# Patient Record
Sex: Female | Born: 1980 | Race: Black or African American | Hispanic: No | Marital: Single | Smoking: Light tobacco smoker
Health system: Southern US, Community
[De-identification: ages and names within clinical notes are randomized; demographics above are authoritative.]

---

## 2002-06-10 ENCOUNTER — Emergency Department (HOSPITAL_COMMUNITY): Admission: EM | Admit: 2002-06-10 | Discharge: 2002-06-10 | Payer: Self-pay | Admitting: Emergency Medicine

## 2003-05-25 ENCOUNTER — Emergency Department (HOSPITAL_COMMUNITY): Admission: EM | Admit: 2003-05-25 | Discharge: 2003-05-25 | Payer: Self-pay | Admitting: Emergency Medicine

## 2014-02-06 ENCOUNTER — Encounter: Payer: Self-pay | Admitting: Internal Medicine

## 2014-02-06 ENCOUNTER — Ambulatory Visit: Payer: Medicaid Other | Attending: Internal Medicine | Admitting: Internal Medicine

## 2014-02-06 VITALS — BP 118/82 | HR 89 | Temp 98.0°F | Resp 16 | Wt 148.4 lb

## 2014-02-06 DIAGNOSIS — Z139 Encounter for screening, unspecified: Secondary | ICD-10-CM | POA: Diagnosis not present

## 2014-02-06 DIAGNOSIS — L709 Acne, unspecified: Secondary | ICD-10-CM

## 2014-02-06 LAB — COMPLETE METABOLIC PANEL WITH GFR
ALT: 20 U/L (ref 0–35)
AST: 21 U/L (ref 0–37)
Albumin: 3.7 g/dL (ref 3.5–5.2)
Alkaline Phosphatase: 62 U/L (ref 39–117)
BILIRUBIN TOTAL: 0.6 mg/dL (ref 0.2–1.2)
BUN: 8 mg/dL (ref 6–23)
CO2: 26 mEq/L (ref 19–32)
Calcium: 9.6 mg/dL (ref 8.4–10.5)
Chloride: 103 mEq/L (ref 96–112)
Creat: 0.88 mg/dL (ref 0.50–1.10)
GFR, Est African American: >89 mL/min
GFR, Est Non African American: 88 mL/min
GLUCOSE: 77 mg/dL (ref 70–99)
Potassium: 4.4 mEq/L (ref 3.5–5.3)
SODIUM: 139 meq/L (ref 135–145)
TOTAL PROTEIN: 6.6 g/dL (ref 6.0–8.3)

## 2014-02-06 LAB — CBC WITH DIFFERENTIAL/PLATELET
BASOS ABS: 0 10*3/uL (ref 0.0–0.1)
Basophils Relative: 0 % (ref 0–1)
Eosinophils Absolute: 0.1 10*3/uL (ref 0.0–0.7)
Eosinophils Relative: 1 % (ref 0–5)
HCT: 40.3 % (ref 36.0–46.0)
HEMOGLOBIN: 13.6 g/dL (ref 12.0–15.0)
LYMPHS ABS: 2.6 10*3/uL (ref 0.7–4.0)
Lymphocytes Relative: 33 % (ref 12–46)
MCH: 28 pg (ref 26.0–34.0)
MCHC: 33.7 g/dL (ref 30.0–36.0)
MCV: 83.1 fL (ref 78.0–100.0)
MONOS PCT: 8 % (ref 3–12)
MPV: 11.8 fL (ref 8.6–12.4)
Monocytes Absolute: 0.6 10*3/uL (ref 0.1–1.0)
Neutro Abs: 4.5 10*3/uL (ref 1.7–7.7)
Neutrophils Relative %: 58 % (ref 43–77)
PLATELETS: 223 10*3/uL (ref 150–400)
RBC: 4.85 MIL/uL (ref 3.87–5.11)
RDW: 16.2 % — AB (ref 11.5–15.5)
WBC: 7.8 10*3/uL (ref 4.0–10.5)

## 2014-02-06 LAB — HEMOGLOBIN A1C
HEMOGLOBIN A1C: 6 % — AB (ref <5.7)
MEAN PLASMA GLUCOSE: 126 mg/dL — AB (ref <117)

## 2014-02-06 NOTE — Progress Notes (Signed)
Patient here to establish care Requesting a referral to dermatology for her acne Patient does not want the flu vaccine

## 2014-02-06 NOTE — Progress Notes (Signed)
Patient Demographics  Marisa Frazier, is a 34 y.o. female  ZOX:[id number]  WJX:[id number]  DOB - Jun 15, 1980  CC:  Chief Complaint  Patient presents with  . Establish Care       HPI: Marisa Frazier is a 34 y.o. female here today to establish medical care.Patient reported to have history of acne for several years as per patient she recently moved to [facility] and apparently she was following up with her dermatologist and has been  Using topical creams including tretinoin, adapalene  benzoyl peroxide , topical clindamycin as well as oral doxycycline, as per patient his medication still does not work well, patient is requesting referral to see a dermatologist. She denies any acute symptoms. Patient has No headache, No chest pain, No abdominal pain - No Nausea, No new weakness tingling or numbness, No Cough - SOB.  Allergies  Allergen Reactions  . Dilaudid [Hydromorphone Hcl]    History reviewed. No pertinent past medical history. No current outpatient prescriptions on file prior to visit.   No current facility-administered medications on file prior to visit.   Family History  Problem Relation Age of Onset  . Heart disease Maternal Grandmother    History   Social History  . Marital Status: Single    Spouse Name: N/A    Number of Children: N/A  . Years of Education: N/A   Occupational History  . Not on file.   Social History Main Topics  . Smoking status: Never Smoker   . Smokeless tobacco: Not on file  . Alcohol Use: No  . Drug Use: Not on file  . Sexual Activity: Not on file   Other Topics Concern  . Not on file   Social History Narrative  . No narrative on file    Review of Systems: Constitutional: Negative for fever, chills, diaphoresis, activity change, appetite change and fatigue. HENT: Negative for ear pain, nosebleeds, congestion, facial swelling, rhinorrhea, neck pain, neck stiffness and ear discharge.  Eyes: Negative for pain, discharge,  redness, itching and visual disturbance. Respiratory: Negative for cough, choking, chest tightness, shortness of breath, wheezing and stridor.  Cardiovascular: Negative for chest pain, palpitations and leg swelling. Gastrointestinal: Negative for abdominal distention. Genitourinary: Negative for dysuria, urgency, frequency, hematuria, flank pain, decreased urine volume, difficulty urinating and dyspareunia.  Musculoskeletal: Negative for back pain, joint swelling, arthralgia and gait problem. Neurological: Negative for dizziness, tremors, seizures, syncope, facial asymmetry, speech difficulty, weakness, light-headedness, numbness and headaches.  Hematological: Negative for adenopathy. Does not bruise/bleed easily. Psychiatric/Behavioral: Negative for hallucinations, behavioral problems, confusion, dysphoric mood, decreased concentration and agitation.    Objective:   Filed Vitals:   02/06/14 1005  BP: 118/82  Pulse: 89  Temp: 98 F (36.7 C)  Resp: 16    Physical Exam: Constitutional: Patient appears well-developed and well-nourished. No distress. HENT: Normocephalic, atraumatic, External right and left ear normal. Oropharynx is clear and moist.  Eyes: Conjunctivae and EOM are normal. PERRLA, no scleral icterus. Neck: Normal ROM. Neck supple. No JVD. No tracheal deviation. No thyromegaly. CVS: RRR, S1/S2 +, no murmurs, no gallops, no carotid bruit.  Pulmonary: Effort and breath sounds normal, no stridor, rhonchi, wheezes, rales.  Abdominal: Soft. BS +, no distension, tenderness, rebound or guarding.  Musculoskeletal: Normal range of motion. No edema and no tenderness.  Neuro: Alert. Normal reflexes, muscle tone coordination. No cranial nerve deficit. Skin: Skin is warm and dry. No rash noted. Not diaphoretic. No erythema. No pallor. Psychiatric: Normal mood and affect. Behavior,  judgment, thought content normal.  No results found for: WBC, HGB, HCT, MCV, PLT No results found for:  CREATININE, BUN, NA, K, CL, CO2  No results found for: HGBA1C Lipid Panel  No results found for: CHOL, TRIG, HDL, CHOLHDL, VLDL, LDLCALC     Assessment and plan:   1. Acne, unspecified acne type Continue with current medications, - Ambulatory referral to Dermatology  2. Screening Ordered baseline blood work. - CBC with Differential/Platelet - COMPLETE METABOLIC PANEL WITH GFR - TSH - Vit D  25 hydroxy (rtn osteoporosis monitoring) - Hemoglobin A1c        Health Maintenance  -Pap Smear:Will be scheduled  -Vaccinations:  Patient declines flu shot.   Return in about 3 months (around 05/07/2014), or if symptoms worsen or fail to improve, for Schedule Appt with Dr Glendora ScoreFunches/ Valerie for PAP.   Doris CheadleADVANI, Jaston Havens, MD

## 2014-02-07 LAB — VITAMIN D 25 HYDROXY (VIT D DEFICIENCY, FRACTURES): Vit D, 25-Hydroxy: 29 ng/mL — ABNORMAL LOW (ref 30–100)

## 2014-02-07 LAB — TSH: TSH: 0.629 u[IU]/mL (ref 0.350–4.500)

## 2014-02-14 ENCOUNTER — Telehealth: Payer: Self-pay

## 2014-02-14 NOTE — Telephone Encounter (Signed)
Patient not available Unable to leave message Home number does not accept call Work number has been disconnected

## 2014-02-14 NOTE — Telephone Encounter (Signed)
-----   Message from Doris Cheadleeepak Advani, MD sent at 02/07/2014  9:12 AM EST ----- Blood work reviewed noticed hemoglobin A1c of 6.0%, patient has prediabetes, call and advise patient for low carbohydrate diet.  noticed low vitamin D, advise patient to start taking OTC 2000 units daily.

## 2014-04-29 ENCOUNTER — Ambulatory Visit: Payer: Medicaid Other

## 2014-07-21 ENCOUNTER — Emergency Department (HOSPITAL_COMMUNITY): Payer: Medicaid Other

## 2014-07-21 ENCOUNTER — Encounter (HOSPITAL_COMMUNITY): Payer: Self-pay | Admitting: *Deleted

## 2014-07-21 ENCOUNTER — Emergency Department (HOSPITAL_COMMUNITY)
Admission: EM | Admit: 2014-07-21 | Discharge: 2014-07-21 | Disposition: A | Discharge status: Home / Self Care | Payer: Medicaid Other | Attending: Emergency Medicine | Admitting: Emergency Medicine

## 2014-07-21 DIAGNOSIS — Z792 Long term (current) use of antibiotics: Secondary | ICD-10-CM | POA: Insufficient documentation

## 2014-07-21 DIAGNOSIS — Y9389 Activity, other specified: Secondary | ICD-10-CM | POA: Insufficient documentation

## 2014-07-21 DIAGNOSIS — Y998 Other external cause status: Secondary | ICD-10-CM | POA: Insufficient documentation

## 2014-07-21 DIAGNOSIS — S161XXA Strain of muscle, fascia and tendon at neck level, initial encounter: Secondary | ICD-10-CM | POA: Diagnosis not present

## 2014-07-21 DIAGNOSIS — S199XXA Unspecified injury of neck, initial encounter: Secondary | ICD-10-CM | POA: Diagnosis present

## 2014-07-21 DIAGNOSIS — Y92410 Unspecified street and highway as the place of occurrence of the external cause: Secondary | ICD-10-CM | POA: Diagnosis not present

## 2014-07-21 MED ORDER — CYCLOBENZAPRINE HCL 10 MG PO TABS
10.0000 mg | ORAL_TABLET | Freq: Two times a day (BID) | ORAL | Status: DC | PRN
Start: 2014-07-21 — End: 2014-09-09

## 2014-07-21 MED ORDER — IBUPROFEN 800 MG PO TABS: 800.0000 mg | ORAL_TABLET | Freq: Three times a day (TID) | ORAL | Status: DC

## 2014-07-21 MED ORDER — IBUPROFEN 800 MG PO TABS
800.0000 mg | ORAL_TABLET | Freq: Once | ORAL | Status: AC
  Administered 2014-07-21: 800 mg via ORAL
  Filled 2014-07-21: qty 1

## 2014-07-21 NOTE — Discharge Instructions (Signed)
Cervical Sprain °A cervical sprain is an injury in the neck in which the strong, fibrous tissues (ligaments) that connect your neck bones stretch or tear. Cervical sprains can range from mild to severe. Severe cervical sprains can cause the neck vertebrae to be unstable. This can lead to damage of the spinal cord and can result in serious nervous system problems. The amount of time it takes for a cervical sprain to get better depends on the cause and extent of the injury. Most cervical sprains heal in 1 to 3 weeks. °CAUSES  °Severe cervical sprains may be caused by:  °· Contact sport injuries (such as from football, rugby, wrestling, hockey, auto racing, gymnastics, diving, martial arts, or boxing).   °· Motor vehicle collisions.   °· Whiplash injuries. This is an injury from a sudden forward and backward whipping movement of the head and neck.  °· Falls.   °Mild cervical sprains may be caused by:  °· Being in an awkward position, such as while cradling a telephone between your ear and shoulder.   °· Sitting in a chair that does not offer proper support.   °· Working at a poorly designed computer station.   °· Looking up or down for long periods of time.   °SYMPTOMS  °· Pain, soreness, stiffness, or a burning sensation in the front, back, or sides of the neck. This discomfort may develop immediately after the injury or slowly, 24 hours or more after the injury.   °· Pain or tenderness directly in the middle of the back of the neck.   °· Shoulder or upper back pain.   °· Limited ability to move the neck.   °· Headache.   °· Dizziness.   °· Weakness, numbness, or tingling in the hands or arms.   °· Muscle spasms.   °· Difficulty swallowing or chewing.   °· Tenderness and swelling of the neck.   °DIAGNOSIS  °Most of the time your health care provider can diagnose a cervical sprain by taking your history and doing a physical exam. Your health care provider will ask about previous neck injuries and any known neck  problems, such as arthritis in the neck. X-rays may be taken to find out if there are any other problems, such as with the bones of the neck. Other tests, such as a CT scan or MRI, may also be needed.  °TREATMENT  °Treatment depends on the severity of the cervical sprain. Mild sprains can be treated with rest, keeping the neck in place (immobilization), and pain medicines. Severe cervical sprains are immediately immobilized. Further treatment is done to help with pain, muscle spasms, and other symptoms and may include: °· Medicines, such as pain relievers, numbing medicines, or muscle relaxants.   °· Physical therapy. This may involve stretching exercises, strengthening exercises, and posture training. Exercises and improved posture can help stabilize the neck, strengthen muscles, and help stop symptoms from returning.   °HOME CARE INSTRUCTIONS  °· Put ice on the injured area.   °· Put ice in a plastic bag.   °· Place a towel between your skin and the bag.   °· Leave the ice on for 15-20 minutes, 3-4 times a day.   °· If your injury was severe, you may have been given a cervical collar to wear. A cervical collar is a two-piece collar designed to keep your neck from moving while it heals. °· Do not remove the collar unless instructed by your health care provider. °· If you have long hair, keep it outside of the collar. °· Ask your health care provider before making any adjustments to your collar. Minor   adjustments may be required over time to improve comfort and reduce pressure on your chin or on the back of your head. °· If you are allowed to remove the collar for cleaning or bathing, follow your health care provider's instructions on how to do so safely. °· Keep your collar clean by wiping it with mild soap and water and drying it completely. If the collar you have been given includes removable pads, remove them every 1-2 days and hand wash them with soap and water. Allow them to air dry. They should be completely  dry before you wear them in the collar. °· If you are allowed to remove the collar for cleaning and bathing, wash and dry the skin of your neck. Check your skin for irritation or sores. If you see any, tell your health care provider. °· Do not drive while wearing the collar.   °· Only take over-the-counter or prescription medicines for pain, discomfort, or fever as directed by your health care provider.   °· Keep all follow-up appointments as directed by your health care provider.   °· Keep all physical therapy appointments as directed by your health care provider.   °· Make any needed adjustments to your workstation to promote good posture.   °· Avoid positions and activities that make your symptoms worse.   °· Warm up and stretch before being active to help prevent problems.   °SEEK MEDICAL CARE IF:  °· Your pain is not controlled with medicine.   °· You are unable to decrease your pain medicine over time as planned.   °· Your activity level is not improving as expected.   °SEEK IMMEDIATE MEDICAL CARE IF:  °· You develop any bleeding. °· You develop stomach upset. °· You have signs of an allergic reaction to your medicine.   °· Your symptoms get worse.   °· You develop new, unexplained symptoms.   °· You have numbness, tingling, weakness, or paralysis in any part of your body.   °MAKE SURE YOU:  °· Understand these instructions. °· Will watch your condition. °· Will get help right away if you are not doing well or get worse. °Document Released: 10/18/2006 Document Revised: 12/26/2012 Document Reviewed: 06/28/2012 °ExitCare® Patient Information ©2015 [employer]. This information is not intended to replace advice given to you by your health care provider. Make sure you discuss any questions you have with your health care provider. ° °Cryotherapy °Cryotherapy means treatment with cold. Ice or gel packs can be used to reduce both pain and swelling. Ice is the most helpful within the first 24 to 48 hours after an  injury or flare-up from overusing a muscle or joint. Sprains, strains, spasms, burning pain, shooting pain, and aches can all be eased with ice. Ice can also be used when recovering from surgery. Ice is effective, has very few side effects, and is safe for most people to use. °PRECAUTIONS  °Ice is not a safe treatment option for people with: °· Raynaud phenomenon. This is a condition affecting small blood vessels in the extremities. Exposure to cold may cause your problems to return. °· Cold hypersensitivity. There are many forms of cold hypersensitivity, including: °¨ Cold urticaria. Red, itchy hives appear on the skin when the tissues begin to warm after being iced. °¨ Cold erythema. This is a red, itchy rash caused by exposure to cold. °¨ Cold hemoglobinuria. Red blood cells break down when the tissues begin to warm after being iced. The hemoglobin that carry oxygen are passed into the urine because they cannot combine with blood proteins fast enough. °·   Numbness or altered sensitivity in the area being iced. °If you have any of the following conditions, do not use ice until you have discussed cryotherapy with your caregiver: °· Heart conditions, such as arrhythmia, angina, or chronic heart disease. °· High blood pressure. °· Healing wounds or open skin in the area being iced. °· Current infections. °· Rheumatoid arthritis. °· Poor circulation. °· Diabetes. °Ice slows the blood flow in the region it is applied. This is beneficial when trying to stop inflamed tissues from spreading irritating chemicals to surrounding tissues. However, if you expose your skin to cold temperatures for too long or without the proper protection, you can damage your skin or nerves. Watch for signs of skin damage due to cold. °HOME CARE INSTRUCTIONS °Follow these tips to use ice and cold packs safely. °· Place a dry or damp towel between the ice and skin. A damp towel will cool the skin more quickly, so you may need to shorten the time  that the ice is used. °· For a more rapid response, add gentle compression to the ice. °· Ice for no more than 10 to 20 minutes at a time. The bonier the area you are icing, the less time it will take to get the benefits of ice. °· Check your skin after 5 minutes to make sure there are no signs of a poor response to cold or skin damage. °· Rest 20 minutes or more between uses. °· Once your skin is numb, you can end your treatment. You can test numbness by very lightly touching your skin. The touch should be so light that you do not see the skin dimple from the pressure of your fingertip. When using ice, most people will feel these normal sensations in this order: cold, burning, aching, and numbness. °· Do not use ice on someone who cannot communicate their responses to pain, such as small children or people with dementia. °HOW TO MAKE AN ICE PACK °Ice packs are the most common way to use ice therapy. Other methods include ice massage, ice baths, and cryosprays. Muscle creams that cause a cold, tingly feeling do not offer the same benefits that ice offers and should not be used as a substitute unless recommended by your caregiver. °To make an ice pack, do one of the following: °· Place crushed ice or a bag of frozen vegetables in a sealable plastic bag. Squeeze out the excess air. Place this bag inside another plastic bag. Slide the bag into a pillowcase or place a damp towel between your skin and the bag. °· Mix 3 parts water with 1 part rubbing alcohol. Freeze the mixture in a sealable plastic bag. When you remove the mixture from the freezer, it will be slushy. Squeeze out the excess air. Place this bag inside another plastic bag. Slide the bag into a pillowcase or place a damp towel between your skin and the bag. °SEEK MEDICAL CARE IF: °· You develop white spots on your skin. This may give the skin a blotchy (mottled) appearance. °· Your skin turns blue or pale. °· Your skin becomes waxy or hard. °· Your swelling  gets worse. °MAKE SURE YOU:  °· Understand these instructions. °· Will watch your condition. °· Will get help right away if you are not doing well or get worse. °Document Released: 08/17/2010 Document Revised: 05/07/2013 Document Reviewed: 08/17/2010 °ExitCare® Patient Information ©2015 [employer]. This information is not intended to replace advice given to you by your health care provider. Make   sure you discuss any questions you have with your health care provider. °Motor Vehicle Collision °It is common to have multiple bruises and sore muscles after a motor vehicle collision (MVC). These tend to feel worse for the first 24 hours. You may have the most stiffness and soreness over the first several hours. You may also feel worse when you wake up the first morning after your collision. After this point, you will usually begin to improve with each day. The speed of improvement often depends on the severity of the collision, the number of injuries, and the location and nature of these injuries. °HOME CARE INSTRUCTIONS °· Put ice on the injured area. °¨ Put ice in a plastic bag. °¨ Place a towel between your skin and the bag. °¨ Leave the ice on for 15-20 minutes, 3-4 times a day, or as directed by your health care provider. °· Drink enough fluids to keep your urine clear or pale yellow. Do not drink alcohol. °· Take a warm shower or bath once or twice a day. This will increase blood flow to sore muscles. °· You may return to activities as directed by your caregiver. Be careful when lifting, as this may aggravate neck or back pain. °· Only take over-the-counter or prescription medicines for pain, discomfort, or fever as directed by your caregiver. Do not use aspirin. This may increase bruising and bleeding. °SEEK IMMEDIATE MEDICAL CARE IF: °· You have numbness, tingling, or weakness in the arms or legs. °· You develop severe headaches not relieved with medicine. °· You have severe neck pain, especially tenderness in  the middle of the back of your neck. °· You have changes in bowel or bladder control. °· There is increasing pain in any area of the body. °· You have shortness of breath, light-headedness, dizziness, or fainting. °· You have chest pain. °· You feel sick to your stomach (nauseous), throw up (vomit), or sweat. °· You have increasing abdominal discomfort. °· There is blood in your urine, stool, or vomit. °· You have pain in your shoulder (shoulder strap areas). °· You feel your symptoms are getting worse. °MAKE SURE YOU: °· Understand these instructions. °· Will watch your condition. °· Will get help right away if you are not doing well or get worse. °Document Released: 12/21/2004 Document Revised: 05/07/2013 Document Reviewed: 05/20/2010 °ExitCare® Patient Information ©2015 [employer]. This information is not intended to replace advice given to you by your health care provider. Make sure you discuss any questions you have with your health care provider. ° °
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
## 2014-07-26 ENCOUNTER — Emergency Department (HOSPITAL_COMMUNITY)
Admission: EM | Admit: 2014-07-26 | Discharge: 2014-07-26 | Disposition: A | Discharge status: Home / Self Care | Payer: Medicaid Other | Attending: Emergency Medicine | Admitting: Emergency Medicine

## 2014-07-26 ENCOUNTER — Encounter (HOSPITAL_COMMUNITY): Payer: Self-pay | Admitting: Emergency Medicine

## 2014-07-26 DIAGNOSIS — Z791 Long term (current) use of non-steroidal anti-inflammatories (NSAID): Secondary | ICD-10-CM | POA: Diagnosis not present

## 2014-07-26 DIAGNOSIS — M62838 Other muscle spasm: Secondary | ICD-10-CM

## 2014-07-26 DIAGNOSIS — Y998 Other external cause status: Secondary | ICD-10-CM | POA: Insufficient documentation

## 2014-07-26 DIAGNOSIS — Y92410 Unspecified street and highway as the place of occurrence of the external cause: Secondary | ICD-10-CM | POA: Insufficient documentation

## 2014-07-26 DIAGNOSIS — Y9389 Activity, other specified: Secondary | ICD-10-CM | POA: Diagnosis not present

## 2014-07-26 DIAGNOSIS — S199XXA Unspecified injury of neck, initial encounter: Secondary | ICD-10-CM | POA: Diagnosis not present

## 2014-07-26 DIAGNOSIS — S46811A Strain of other muscles, fascia and tendons at shoulder and upper arm level, right arm, initial encounter: Secondary | ICD-10-CM

## 2014-07-26 DIAGNOSIS — Z792 Long term (current) use of antibiotics: Secondary | ICD-10-CM | POA: Insufficient documentation

## 2014-07-26 DIAGNOSIS — S29012A Strain of muscle and tendon of back wall of thorax, initial encounter: Secondary | ICD-10-CM | POA: Diagnosis not present

## 2014-07-26 MED ORDER — DIAZEPAM 5 MG PO TABS: 5.0000 mg | ORAL_TABLET | Freq: Three times a day (TID) | ORAL | Status: DC | PRN

## 2014-07-26 MED ORDER — NAPROXEN 500 MG PO TABS: 500.0000 mg | ORAL_TABLET | Freq: Two times a day (BID) | ORAL | Status: DC
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
## 2014-08-14 ENCOUNTER — Inpatient Hospital Stay: Payer: Medicaid Other | Admitting: Family Medicine

## 2014-09-09 ENCOUNTER — Encounter (HOSPITAL_BASED_OUTPATIENT_CLINIC_OR_DEPARTMENT_OTHER): Payer: Self-pay | Admitting: *Deleted

## 2014-09-09 ENCOUNTER — Emergency Department (HOSPITAL_BASED_OUTPATIENT_CLINIC_OR_DEPARTMENT_OTHER)
Admission: EM | Admit: 2014-09-09 | Discharge: 2014-09-09 | Disposition: A | Discharge status: Home / Self Care | Payer: Medicaid Other | Attending: Emergency Medicine | Admitting: Emergency Medicine

## 2014-09-09 DIAGNOSIS — R110 Nausea: Secondary | ICD-10-CM | POA: Insufficient documentation

## 2014-09-09 DIAGNOSIS — R42 Dizziness and giddiness: Secondary | ICD-10-CM

## 2014-09-09 DIAGNOSIS — Z791 Long term (current) use of non-steroidal anti-inflammatories (NSAID): Secondary | ICD-10-CM | POA: Diagnosis not present

## 2014-09-09 DIAGNOSIS — Z79899 Other long term (current) drug therapy: Secondary | ICD-10-CM | POA: Insufficient documentation

## 2014-09-09 DIAGNOSIS — Z3202 Encounter for pregnancy test, result negative: Secondary | ICD-10-CM | POA: Insufficient documentation

## 2014-09-09 LAB — CBC WITH DIFFERENTIAL/PLATELET
BASOS PCT: 0 % (ref 0–1)
Basophils Absolute: 0 10*3/uL (ref 0.0–0.1)
EOS ABS: 0 10*3/uL (ref 0.0–0.7)
Eosinophils Relative: 1 % (ref 0–5)
HCT: 40.3 % (ref 36.0–46.0)
Hemoglobin: 13.4 g/dL (ref 12.0–15.0)
Lymphocytes Relative: 25 % (ref 12–46)
Lymphs Abs: 1.9 10*3/uL (ref 0.7–4.0)
MCH: 28.8 pg (ref 26.0–34.0)
MCHC: 33.3 g/dL (ref 30.0–36.0)
MCV: 86.7 fL (ref 78.0–100.0)
MONOS PCT: 6 % (ref 3–12)
Monocytes Absolute: 0.5 10*3/uL (ref 0.1–1.0)
NEUTROS ABS: 5.1 10*3/uL (ref 1.7–7.7)
NEUTROS PCT: 68 % (ref 43–77)
Platelets: 174 10*3/uL (ref 150–400)
RBC: 4.65 MIL/uL (ref 3.87–5.11)
RDW: 13.7 % (ref 11.5–15.5)
WBC: 7.6 10*3/uL (ref 4.0–10.5)

## 2014-09-09 LAB — BASIC METABOLIC PANEL
ANION GAP: 7 (ref 5–15)
BUN: 10 mg/dL (ref 6–20)
CALCIUM: 9.6 mg/dL (ref 8.9–10.3)
CO2: 24 mmol/L (ref 22–32)
CREATININE: 1.01 mg/dL — AB (ref 0.44–1.00)
Chloride: 106 mmol/L (ref 101–111)
Glucose, Bld: 96 mg/dL (ref 65–99)
Potassium: 4.4 mmol/L (ref 3.5–5.1)
Sodium: 137 mmol/L (ref 135–145)

## 2014-09-09 LAB — HCG, SERUM, QUALITATIVE: PREG SERUM: NEGATIVE

## 2014-09-09 MED ORDER — MECLIZINE HCL 25 MG PO TABS: 25.0000 mg | ORAL_TABLET | Freq: Once | ORAL | Status: DC

## 2014-09-09 MED ORDER — MECLIZINE HCL 25 MG PO TABS: 25.0000 mg | ORAL_TABLET | Freq: Three times a day (TID) | ORAL | Status: DC | PRN

## 2014-09-09 NOTE — ED Provider Notes (Signed)
CSN: [id number]     Arrival date & time 09/09/14  1145 History   First MD Initiated Contact with Patient 09/09/14 1228     Chief Complaint  Patient presents with  . Dizziness     (Consider location/radiation/quality/duration/timing/severity/associated sxs/prior Treatment) HPI Comments: Patient is a 34 year old female with no significant past medical history. She presents for evaluation of dizziness. 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 While cleaning of patient, she became acutely dizzy and felt as if she was spinning. She felt very nauseated but did not vomit. Every time she tried to stand up the dizziness worsened, and was eventually brought here by EMS for evaluation.  Patient is a 34 y.o. female presenting with dizziness. The history is provided by the patient.  Dizziness Quality:  Imbalance and head spinning Severity:  Moderate Onset quality:  Sudden Timing:  Constant Progression:  Unchanged Chronicity:  New Context: head movement   Context: not when bending over and not with loss of consciousness   Relieved by:  Nothing Worsened by:  Movement, turning head and standing up Ineffective treatments:  None tried Associated symptoms: nausea   Associated symptoms: no chest pain and no vomiting     History reviewed. No pertinent past medical history. History reviewed. No pertinent past surgical history. Family History  Problem Relation Age of Onset  . Heart disease Maternal Grandmother    Social History  Substance Use Topics  . Smoking status: Never Smoker   . Smokeless tobacco: None  . Alcohol Use: No   OB History    No data available     Review of Systems  Cardiovascular: Negative for chest pain.  Gastrointestinal: Positive for nausea. Negative for vomiting.  Neurological: Positive for dizziness.  All other systems reviewed and are negative.     Allergies  Dilaudid  Home Medications   Prior to Admission medications   Medication Sig Start Date End Date  Taking? Authorizing Provider  doxycycline (DORYX) 100 MG DR capsule Take 100 mg by mouth 2 (two) times daily.    Historical Provider, MD  naproxen (NAPROSYN) 500 MG tablet Take 1 tablet (500 mg total) by mouth 2 (two) times daily with a meal. 07/26/14   Mercedes Camprubi-Soms, PA-C  tretinoin (RETIN-A) 0.025 % cream Apply topically at bedtime.    Historical Provider, MD   BP 133/66 mmHg  Pulse 106  Temp(Src) 98.2 F (36.8 C) (Oral)  Resp 30  SpO2 100% Physical Exam  Constitutional: She is oriented to person, place, and time. She appears well-developed and well-nourished. No distress.  HENT:  Head: Normocephalic and atraumatic.  Mouth/Throat: Oropharynx is clear and moist.  TMs are clear bilaterally.  Eyes: EOM are normal. Pupils are equal, round, and reactive to light.  Neck: Normal range of motion. Neck supple.  Cardiovascular: Normal rate and regular rhythm.  Exam reveals no gallop and no friction rub.   No murmur heard. Pulmonary/Chest: Effort normal and breath sounds normal. No respiratory distress. She has no wheezes.  Abdominal: Soft. Bowel sounds are normal. She exhibits no distension. There is no tenderness.  Musculoskeletal: Normal range of motion.  Neurological: She is alert and oriented to person, place, and time. No cranial nerve deficit. She exhibits normal muscle tone. Coordination normal.  Patient has a positive Halpike maneuver.  Skin: Skin is warm and dry. She is not diaphoretic.  Nursing note and vitals reviewed.   ED Course  Procedures (including critical care time) Labs Review Labs Reviewed  BASIC METABOLIC  PANEL  CBC WITH DIFFERENTIAL/PLATELET  HCG, SERUM, QUALITATIVE    Imaging Review No results found. I have personally reviewed and evaluated these images and lab results as part of my medical decision-making.   EKG Interpretation   Date/Time:  Monday September 09 2014 12:00:25 EDT Ventricular Rate:  102 PR Interval:  142 QRS Duration: 90 QT  Interval:  350 QTC Calculation: 456 R Axis:   68 Text Interpretation:  Sinus tachycardia Possible Anterior infarct , age  undetermined Abnormal ECG Confirmed by Everlean Bucher  MD, Grisell Bissette ([id number]) on  09/09/2014 12:07:28 PM      MDM   Final diagnoses:  None    Patient's presentation, physical exam, and workup are consistent with a peripheral vertigo. She is now completely improved with meclizine while in the ER. Laboratory studies and EKG are unremarkable and I believe she is appropriate for discharge. She is to return as symptoms worsen or change.    Geoffery Lyons, MD 09/09/14 [phone]

## 2014-09-09 NOTE — ED Notes (Signed)
Sudden onset of dizziness and nausea.  Denies vomiting.  States that she was cleaning up a pt that had soiled themselves.  No acute distress noted.  Pt noted to be hyperventaliting but speaking in complete sentences.

## 2014-09-09 NOTE — Discharge Instructions (Signed)
Meclizine as prescribed if your symptoms recur.  Return to the ER if your symptoms significantly worsen, change, or if you develop other new and concerning symptoms.   Vertigo Vertigo means you feel like you or your surroundings are moving when they are not. Vertigo can be dangerous if it occurs when you are at work, driving, or performing difficult activities.  CAUSES  Vertigo occurs when there is a conflict of signals sent to your brain from the visual and sensory systems in your body. There are many different causes of vertigo, including:  Infections, especially in the inner ear.  A bad reaction to a drug or misuse of alcohol and medicines.  Withdrawal from drugs or alcohol.  Rapidly changing positions, such as lying down or rolling over in bed.  A migraine headache.  Decreased blood flow to the brain.  Increased pressure in the brain from a head injury, infection, tumor, or bleeding. SYMPTOMS  You may feel as though the world is spinning around or you are falling to the ground. Because your balance is upset, vertigo can cause nausea and vomiting. You may have involuntary eye movements (nystagmus). DIAGNOSIS  Vertigo is usually diagnosed by physical exam. If the cause of your vertigo is unknown, your caregiver may perform imaging tests, such as an MRI scan (magnetic resonance imaging). TREATMENT  Most cases of vertigo resolve on their own, without treatment. Depending on the cause, your caregiver may prescribe certain medicines. If your vertigo is related to body position issues, your caregiver may recommend movements or procedures to correct the problem. In rare cases, if your vertigo is caused by certain inner ear problems, you may need surgery. HOME CARE INSTRUCTIONS   Follow your caregiver's instructions.  Avoid driving.  Avoid operating heavy machinery.  Avoid performing any tasks that would be dangerous to you or others during a vertigo episode.  Tell your caregiver if  you notice that certain medicines seem to be causing your vertigo. Some of the medicines used to treat vertigo episodes can actually make them worse in some people. SEEK IMMEDIATE MEDICAL CARE IF:   Your medicines do not relieve your vertigo or are making it worse.  You develop problems with talking, walking, weakness, or using your arms, hands, or legs.  You develop severe headaches.  Your nausea or vomiting continues or gets worse.  You develop visual changes.  A family member notices behavioral changes.  Your condition gets worse. MAKE SURE YOU:  Understand these instructions.  Will watch your condition.  Will get help right away if you are not doing well or get worse. Document Released: 09/30/2004 Document Revised: 03/15/2011 Document Reviewed: 07/09/2010 [facility] Patient Information 2015 [place], [place]. This information is not intended to replace advice given to you by your health care provider. Make sure you discuss any questions you have with your health care provider.

## 2014-09-09 NOTE — ED Notes (Addendum)
While undressing patient to help her into a gown, patient assisted in sitting up to take her shirt and undershirt off, Patient started breathing fast and laid back down to her left side. Patient still responsive, talking, advised to take slow deep breaths, Katie RN made aware.

## 2014-12-21 ENCOUNTER — Emergency Department (HOSPITAL_COMMUNITY)
Admission: EM | Admit: 2014-12-21 | Discharge: 2014-12-21 | Disposition: A | Discharge status: Home / Self Care | Payer: Medicaid Other | Attending: Emergency Medicine | Admitting: Emergency Medicine

## 2014-12-21 ENCOUNTER — Encounter (HOSPITAL_COMMUNITY): Payer: Self-pay | Admitting: *Deleted

## 2014-12-21 ENCOUNTER — Emergency Department (HOSPITAL_COMMUNITY): Payer: Medicaid Other

## 2014-12-21 DIAGNOSIS — J029 Acute pharyngitis, unspecified: Secondary | ICD-10-CM | POA: Insufficient documentation

## 2014-12-21 DIAGNOSIS — Z791 Long term (current) use of non-steroidal anti-inflammatories (NSAID): Secondary | ICD-10-CM | POA: Insufficient documentation

## 2014-12-21 DIAGNOSIS — R059 Cough, unspecified: Secondary | ICD-10-CM

## 2014-12-21 DIAGNOSIS — J040 Acute laryngitis: Secondary | ICD-10-CM | POA: Diagnosis not present

## 2014-12-21 DIAGNOSIS — R05 Cough: Secondary | ICD-10-CM | POA: Diagnosis present

## 2014-12-21 DIAGNOSIS — Z792 Long term (current) use of antibiotics: Secondary | ICD-10-CM | POA: Diagnosis not present

## 2014-12-21 LAB — BASIC METABOLIC PANEL
Anion gap: 8 (ref 5–15)
BUN: 8 mg/dL (ref 6–20)
CHLORIDE: 106 mmol/L (ref 101–111)
CO2: 26 mmol/L (ref 22–32)
CREATININE: 0.86 mg/dL (ref 0.44–1.00)
Calcium: 9.2 mg/dL (ref 8.9–10.3)
GFR calc Af Amer: >60 mL/min (ref >60)
GFR calc non Af Amer: >60 mL/min (ref >60)
Glucose, Bld: 101 mg/dL — ABNORMAL HIGH (ref 65–99)
Potassium: 4.1 mmol/L (ref 3.5–5.1)
SODIUM: 140 mmol/L (ref 135–145)

## 2014-12-21 LAB — CBC WITH DIFFERENTIAL/PLATELET
Basophils Absolute: 0 10*3/uL (ref 0.0–0.1)
Basophils Relative: 1 %
EOS ABS: 0.2 10*3/uL (ref 0.0–0.7)
Eosinophils Relative: 2 %
HCT: 39.2 % (ref 36.0–46.0)
HEMOGLOBIN: 12.7 g/dL (ref 12.0–15.0)
LYMPHS ABS: 2.8 10*3/uL (ref 0.7–4.0)
Lymphocytes Relative: 32 %
MCH: 28.7 pg (ref 26.0–34.0)
MCHC: 32.4 g/dL (ref 30.0–36.0)
MCV: 88.5 fL (ref 78.0–100.0)
MONOS PCT: 6 %
Monocytes Absolute: 0.5 10*3/uL (ref 0.1–1.0)
Neutro Abs: 5.2 10*3/uL (ref 1.7–7.7)
Neutrophils Relative %: 59 %
Platelets: 227 10*3/uL (ref 150–400)
RBC: 4.43 MIL/uL (ref 3.87–5.11)
RDW: 13.2 % (ref 11.5–15.5)
WBC: 8.7 10*3/uL (ref 4.0–10.5)

## 2014-12-21 LAB — RAPID STREP SCREEN (MED CTR MEBANE ONLY): STREPTOCOCCUS, GROUP A SCREEN (DIRECT): NEGATIVE

## 2014-12-21 MED ORDER — BENZONATATE 100 MG PO CAPS: 100.0000 mg | ORAL_CAPSULE | Freq: Three times a day (TID) | ORAL | Status: DC | PRN

## 2014-12-21 NOTE — ED Provider Notes (Signed)
CSN: [id number]     Arrival date & time 12/21/14  1526 History   First MD Initiated Contact with Patient 12/21/14 1752     Chief Complaint  Patient presents with  . Cough    HPI  Marisa Frazier is an 34 y.o. female with no significant medical history who presents to the ED for evaluation of cough, sore throat, and hoarse voice. She states her symptoms began three days ago. Her cough is productive of clear/white sputum. She states that she had a fever the first day but none after that. Denies chills. She states she has been taking tylenol, mucinex, and theraflu with no relief. She does endorse getting the flu shot in November. Denies chest pain, SOB, n/v/d, abdominal pain. Denies sick contacts.   History reviewed. No pertinent past medical history. History reviewed. No pertinent past surgical history. Family History  Problem Relation Age of Onset  . Heart disease Maternal Grandmother    Social History  Substance Use Topics  . Smoking status: Never Smoker   . Smokeless tobacco: None  . Alcohol Use: No   OB History    No data available     Review of Systems  All other systems reviewed and are negative.     Allergies  Dilaudid  Home Medications   Prior to Admission medications   Medication Sig Start Date End Date Taking? Authorizing Provider  doxycycline (DORYX) 100 MG DR capsule Take 100 mg by mouth 2 (two) times daily.    Historical Provider, MD  meclizine (ANTIVERT) 25 MG tablet Take 1 tablet (25 mg total) by mouth 3 (three) times daily as needed for dizziness. 09/09/14   Geoffery Lyonsouglas Delo, MD  naproxen (NAPROSYN) 500 MG tablet Take 1 tablet (500 mg total) by mouth 2 (two) times daily with a meal. 07/26/14   Mercedes Camprubi-Soms, PA-C  tretinoin (RETIN-A) 0.025 % cream Apply topically at bedtime.    Historical Provider, MD   BP 131/89 mmHg  Pulse 94  Temp(Src) 98.6 F (37 C) (Oral)  Resp 20  SpO2 98%  LMP 11/20/2014 Physical Exam  Constitutional: She is oriented to  person, place, and time.  HENT:  Right Ear: External ear normal.  Left Ear: External ear normal.  Nose: Nose normal.  Mouth/Throat: Oropharynx is clear and moist and mucous membranes are normal. No oropharyngeal exudate, posterior oropharyngeal edema or posterior oropharyngeal erythema.  Eyes: Conjunctivae and EOM are normal. Pupils are equal, round, and reactive to light.  Neck: Normal range of motion. Neck supple.  Cardiovascular: Normal rate, regular rhythm, normal heart sounds and intact distal pulses.   Pulmonary/Chest: Effort normal and breath sounds normal. No respiratory distress. She has no wheezes. She exhibits no tenderness.  Abdominal: Soft. Bowel sounds are normal. She exhibits no distension. There is no tenderness.  Musculoskeletal: She exhibits no edema.  Neurological: She is alert and oriented to person, place, and time. No cranial nerve deficit.  Skin: Skin is warm and dry.  Psychiatric: She has a normal mood and affect.  Nursing note and vitals reviewed.   ED Course  Procedures (including critical care time) Labs Review Labs Reviewed  BASIC METABOLIC PANEL - Abnormal; Notable for the following:    Glucose, Bld 101 (*)    All other components within normal limits  RAPID STREP SCREEN (NOT AT [facility])  CULTURE, GROUP A STREP  CBC WITH DIFFERENTIAL/PLATELET    Imaging Review Dg Chest 2 View  12/21/2014  CLINICAL DATA:  Cough, cold and fever for  3 days. EXAM: CHEST  2 VIEW COMPARISON:  None. FINDINGS: The heart size and mediastinal contours are within normal limits. Both lungs are clear. The visualized skeletal structures are unremarkable. IMPRESSION: No active cardiopulmonary disease.  No evidence of pneumonia seen. Electronically Signed   By: Bary Richard M.D.   On: 12/21/2014 16:45   I have personally reviewed and evaluated these images and lab results as part of my medical decision-making.   EKG Interpretation None      MDM   Final diagnoses:  Cough   Pharyngitis  Laryngitis    Labs unremarkable. Pt is nontoxic appearing. She has a nonfocal exam. No evidence of pneumonia or other acute pathology on CXR. Suspect viral URI, viral pharyngitis. Rapid strep pending.  Rapid strep negative. Will d/c home with tessalon. ER return precautions given.  Carlene Coria, PA-C 12/21/14 2020  Gerhard Munch, MD 12/21/14 [phone]

## 2014-12-21 NOTE — ED Notes (Signed)
The pt is c/o a cough cold chest soreness congestion sorethroat  Hoarseness.  For 2 days temp this am  Productive cough bloody today.  She had the flu shot in  November  lmp nov 2nd

## 2014-12-21 NOTE — ED Notes (Signed)
Pt takern to xray before i could get strep screen

## 2014-12-21 NOTE — Discharge Instructions (Signed)
You were seen in the ER today for sore throat, cough, and voice loss. Your labs were all normal. You do not have evidence of pneumonia or strep throat. Your symptoms are likely due to a virus. I will give you a prescription for cough medicine to use as needed. You may continue taking mucinex to help cough the phlegm up easier. You may take ibuprofen as needed for sore throat. Please follow up with your primary care provider within one week. Return to the ER for new or worsening symptoms.

## 2014-12-23 LAB — CULTURE, GROUP A STREP: Strep A Culture: NEGATIVE

## 2015-08-09 ENCOUNTER — Emergency Department (HOSPITAL_COMMUNITY)
Admission: EM | Admit: 2015-08-09 | Discharge: 2015-08-09 | Disposition: A | Discharge status: Home / Self Care | Payer: Federal, State, Local not specified - PPO | Attending: Emergency Medicine | Admitting: Emergency Medicine

## 2015-08-09 ENCOUNTER — Encounter (HOSPITAL_COMMUNITY): Payer: Self-pay

## 2015-08-09 ENCOUNTER — Emergency Department (HOSPITAL_COMMUNITY): Payer: Federal, State, Local not specified - PPO

## 2015-08-09 DIAGNOSIS — F17210 Nicotine dependence, cigarettes, uncomplicated: Secondary | ICD-10-CM | POA: Insufficient documentation

## 2015-08-09 DIAGNOSIS — R0789 Other chest pain: Secondary | ICD-10-CM | POA: Diagnosis present

## 2015-08-09 DIAGNOSIS — M25512 Pain in left shoulder: Secondary | ICD-10-CM | POA: Insufficient documentation

## 2015-08-09 LAB — BASIC METABOLIC PANEL
Anion gap: 8 (ref 5–15)
BUN: 17 mg/dL (ref 6–20)
CO2: 21 mmol/L — ABNORMAL LOW (ref 22–32)
CREATININE: 1.03 mg/dL — AB (ref 0.44–1.00)
Calcium: 9.2 mg/dL (ref 8.9–10.3)
Chloride: 108 mmol/L (ref 101–111)
GFR calc Af Amer: >60 mL/min (ref >60)
GFR calc non Af Amer: >60 mL/min (ref >60)
Glucose, Bld: 100 mg/dL — ABNORMAL HIGH (ref 65–99)
Potassium: 4.2 mmol/L (ref 3.5–5.1)
Sodium: 137 mmol/L (ref 135–145)

## 2015-08-09 LAB — I-STAT TROPONIN, ED: Troponin i, poc: 0 ng/mL (ref 0.00–0.08)

## 2015-08-09 LAB — CBC
HCT: 41.1 % (ref 36.0–46.0)
Hemoglobin: 13.7 g/dL (ref 12.0–15.0)
MCH: 29.6 pg (ref 26.0–34.0)
MCHC: 33.3 g/dL (ref 30.0–36.0)
MCV: 88.8 fL (ref 78.0–100.0)
PLATELETS: 241 10*3/uL (ref 150–400)
RBC: 4.63 MIL/uL (ref 3.87–5.11)
RDW: 13.8 % (ref 11.5–15.5)
WBC: 8.9 10*3/uL (ref 4.0–10.5)

## 2015-08-09 MED ORDER — TRAMADOL HCL 50 MG PO TABS: 50.0000 mg | ORAL_TABLET | Freq: Four times a day (QID) | ORAL | 0 refills | Status: AC | PRN

## 2015-08-09 MED ORDER — KETOROLAC TROMETHAMINE 60 MG/2ML IM SOLN
60.0000 mg | Freq: Once | INTRAMUSCULAR | Status: AC
  Administered 2015-08-09: 60 mg via INTRAMUSCULAR
  Filled 2015-08-09: qty 2

## 2015-08-09 MED ORDER — NAPROXEN 500 MG PO TABS: 500.0000 mg | ORAL_TABLET | Freq: Two times a day (BID) | ORAL | 0 refills | Status: AC

## 2015-08-09 NOTE — ED Provider Notes (Signed)
[facility]-EMERGENCY DEPT Provider Note   CSN: [id number] Arrival date & time: 08/09/15  0208  By signing my name below, I, [name], attest that this documentation has been prepared under the direction and in the presence of Gilda Crease, MD. Electronically Signed: [name], ED Scribe. 08/09/15. 2:50 AM.  First MD Initiated Contact with Patient 08/09/15 0242    History   Chief Complaint Chief Complaint  Patient presents with  . Chest Pain   The history is provided by the patient. No language interpreter was used.   HPI Comments: Marisa Frazier is a 35 y.o. female who presents to the Emergency Department complaining of 10/10, left sided, upper chest pain onset 1 day. She states the pain is located in the left shoulder and shoots down to the rest of her chest. Pt states she woke up this morning to the pain. She reports back pain and left arm pain that feels like a muscle spasm. Pt states she took 2 Aleve with no relief to pain. Pt denies injury to arm or new activities that could cause pain.  History reviewed. No pertinent past medical history.  Patient Active Problem List   Diagnosis Date Noted  . Acne 02/06/2014    History reviewed. No pertinent surgical history.  OB History    No data available       Home Medications    Prior to Admission medications   Medication Sig Start Date End Date Taking? Authorizing Provider  acetaminophen (TYLENOL) 500 MG tablet Take 1,000 mg by mouth every 6 (six) hours as needed (for pain.).   Yes Historical Provider, MD  ibuprofen (ADVIL,MOTRIN) 200 MG tablet Take 400 mg by mouth every 8 (eight) hours as needed.   Yes Historical Provider, MD  naproxen sodium (ANAPROX) 220 MG tablet Take 220 mg by mouth 2 (two) times daily as needed (for pain.).   Yes Historical Provider, MD    Family History Family History  Problem Relation Age of Onset  . Heart disease Maternal Grandmother     Social History Social History  Substance Use  Topics  . Smoking status: Light Tobacco Smoker    Types: Cigarettes  . Smokeless tobacco: Never Used  . Alcohol use No     Allergies   Dilaudid [hydromorphone hcl]   Review of Systems Review of Systems  Constitutional: Negative for fever.  Respiratory: Negative for shortness of breath.   Cardiovascular: Positive for chest pain.  Gastrointestinal: Negative for nausea and vomiting.  All other systems reviewed and are negative.  Physical Exam Updated Vital Signs BP 143/99 (BP Location: Right Arm)   Pulse 109   Temp 98.8 F (37.1 C) (Oral)   Resp 18   Ht 5\' 1"  (1.549 m)   Wt 145 lb (65.8 kg)   LMP 07/18/2015 (Exact Date)   SpO2 100%   BMI 27.40 kg/m   Physical Exam  Constitutional: She is oriented to person, place, and time. She appears well-developed and well-nourished. No distress.  HENT:  Head: Normocephalic and atraumatic.  Right Ear: Hearing normal.  Left Ear: Hearing normal.  Nose: Nose normal.  Mouth/Throat: Oropharynx is clear and moist and mucous membranes are normal.  Eyes: Conjunctivae and EOM are normal. Pupils are equal, round, and reactive to light.  Neck: Normal range of motion. Neck supple.  Cardiovascular: Regular rhythm, S1 normal and S2 normal.  Exam reveals no gallop and no friction rub.   No murmur heard. Pulmonary/Chest: Effort normal and breath sounds normal. No respiratory  distress. She exhibits no tenderness.  Abdominal: Soft. Normal appearance and bowel sounds are normal. There is no hepatosplenomegaly. There is no tenderness. There is no rebound, no guarding, no tenderness at McBurney's point and negative Murphy's sign. No hernia.  Musculoskeletal: Normal range of motion.  Left paraspinal cervical muscle tenderness Left posterior shoulder tenderness and spasm Decreased ROM due to pain Diffuse anterior shoulder tenderness  Neurological: She is alert and oriented to person, place, and time. She has normal strength. No cranial nerve deficit or  sensory deficit. Coordination normal. GCS eye subscore is 4. GCS verbal subscore is 5. GCS motor subscore is 6.  Skin: Skin is warm, dry and intact. No rash noted. No cyanosis.  Psychiatric: She has a normal mood and affect. Her speech is normal and behavior is normal. Thought content normal.  Nursing note and vitals reviewed.    ED Treatments / Results  DIAGNOSTIC STUDIES: Oxygen Saturation is 100% on RA, normal by my interpretation.    COORDINATION OF CARE: 2:43 AM Discussed treatment plan with pt at bedside which includes lab work and cardiac monitoring and pt agreed to plan.  Labs (all labs ordered are listed, but only abnormal results are displayed) Labs Reviewed  BASIC METABOLIC PANEL - Abnormal; Notable for the following:       Result Value   CO2 21 (*)    Glucose, Bld 100 (*)    Creatinine, Ser 1.03 (*)    All other components within normal limits  CBC  I-STAT TROPOININ, ED    EKG  EKG Interpretation  Date/Time:  Saturday August 09 2015 02:27:46 EDT Ventricular Rate:  102 PR Interval:    QRS Duration: 91 QT Interval:  314 QTC Calculation: 409 R Axis:   30 Text Interpretation:  Sinus tachycardia Probable left atrial enlargement RSR' in V1 or V2, right VCD or RVH Confirmed by Carlotta Telfair  MD, Ahna Konkle [phone]) on 08/09/2015 2:42:50 AM       Radiology Dg Chest 2 View  Result Date: 08/09/2015 CLINICAL DATA:  35 year old female with chest pain EXAM: CHEST  2 VIEW COMPARISON:  Chest radiograph dated 12/21/2014 FINDINGS: The heart size and mediastinal contours are within normal limits. Both lungs are clear. The visualized skeletal structures are unremarkable. IMPRESSION: No active cardiopulmonary disease. Electronically Signed   By: Elgie Collard M.D.   On: 08/09/2015 03:38    Procedures Procedures (including critical care time)  Medications Ordered in ED Medications  ketorolac (TORADOL) injection 60 mg (not administered)     Initial Impression / Assessment and  Plan / ED Course  I have reviewed the triage vital signs and the nursing notes.  Pertinent labs & imaging results that were available during my care of the patient were reviewed by me and considered in my medical decision making (see chart for details).  Clinical Course   Patient presents with complaints of chest pain. Patient is experiencing severe pain in the area of the left shoulder that radiates up to the neck into the upper left chest wall area. Pain goes down her left arm as well. She does not have any neurologic deficit. Patient has severe worsening of her pain with limited motion of her arm and shoulder area. Area is exquisitely tender to palpation as well. This is not cardiac in nature. Cardiac workup negative. Patient reassured, will be treated for musculoskeletal pain. Rule out cervical radiculopathy, but patient appears to have pain centered around the left shoulder area, likely tendinitis or bursitis.  Final Clinical Impressions(s) /  ED Diagnoses   Final diagnoses:  None  shoulder pain  New Prescriptions New Prescriptions   No medications on file   I personally performed the services described in this documentation, which was scribed in my presence. The recorded information has been reviewed and is accurate.     Gilda Crease, MD 08/09/15 [phone]

## 2015-08-09 NOTE — ED Triage Notes (Signed)
Patient states that woke from her sleep with left sided chest pain and pain in her back and left arm that feels like a spasm.  Patient denies SOB, denies N/V.  Patient rates pain 10/10.

## 2018-08-28 ENCOUNTER — Emergency Department (HOSPITAL_COMMUNITY): Payer: No Typology Code available for payment source

## 2018-08-28 ENCOUNTER — Emergency Department (HOSPITAL_COMMUNITY)
Admission: EM | Admit: 2018-08-28 | Discharge: 2018-08-28 | Disposition: A | Discharge status: Home / Self Care | Payer: No Typology Code available for payment source | Attending: Emergency Medicine | Admitting: Emergency Medicine

## 2018-08-28 ENCOUNTER — Encounter (HOSPITAL_COMMUNITY): Payer: Self-pay | Admitting: Emergency Medicine

## 2018-08-28 DIAGNOSIS — M7918 Myalgia, other site: Secondary | ICD-10-CM

## 2018-08-28 DIAGNOSIS — M25522 Pain in left elbow: Secondary | ICD-10-CM | POA: Diagnosis not present

## 2018-08-28 DIAGNOSIS — F17210 Nicotine dependence, cigarettes, uncomplicated: Secondary | ICD-10-CM | POA: Insufficient documentation

## 2018-08-28 DIAGNOSIS — Y939 Activity, unspecified: Secondary | ICD-10-CM | POA: Insufficient documentation

## 2018-08-28 DIAGNOSIS — M542 Cervicalgia: Secondary | ICD-10-CM | POA: Diagnosis present

## 2018-08-28 DIAGNOSIS — M545 Low back pain: Secondary | ICD-10-CM | POA: Insufficient documentation

## 2018-08-28 DIAGNOSIS — Y999 Unspecified external cause status: Secondary | ICD-10-CM | POA: Diagnosis not present

## 2018-08-28 DIAGNOSIS — Y929 Unspecified place or not applicable: Secondary | ICD-10-CM | POA: Insufficient documentation

## 2018-08-28 MED ORDER — OXYCODONE-ACETAMINOPHEN 5-325 MG PO TABS
1.0000 | ORAL_TABLET | Freq: Once | ORAL | Status: AC
  Administered 2018-08-28: 1 via ORAL
  Filled 2018-08-28: qty 1

## 2018-08-28 MED ORDER — IBUPROFEN 400 MG PO TABS
600.0000 mg | ORAL_TABLET | Freq: Once | ORAL | Status: AC
  Administered 2018-08-28: 600 mg via ORAL
  Filled 2018-08-28: qty 1

## 2018-08-28 MED ORDER — CYCLOBENZAPRINE HCL 10 MG PO TABS
5.0000 mg | ORAL_TABLET | Freq: Once | ORAL | Status: AC
  Administered 2018-08-28: 14:00:00 5 mg via ORAL
  Filled 2018-08-28: qty 1

## 2018-08-28 MED ORDER — CYCLOBENZAPRINE HCL 5 MG PO TABS: 5.0000 mg | ORAL_TABLET | Freq: Two times a day (BID) | ORAL | 0 refills | Status: AC | PRN
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
## 2018-08-28 NOTE — ED Notes (Addendum)
Pt transported to Xray. 

## 2018-08-28 NOTE — Discharge Instructions (Signed)
Please read attached information. If you experience any new or worsening signs or symptoms please return to the emergency room for evaluation. Please follow-up with your primary care provider or specialist as discussed. Please use medication prescribed only as directed and discontinue taking if you have any concerning signs or symptoms.   °

## 2018-08-28 NOTE — ED Notes (Signed)
Pt verbalizes understanding of d/c instructions. Prescriptions reviewed with patient. Pt taken to lobby in wheelchair with all belongings. Waiting for ride.

## 2018-08-28 NOTE — ED Notes (Signed)
ED Provider at bedside. 

## 2019-12-09 IMAGING — CT CT CERVICAL SPINE WITHOUT CONTRAST
3 of 4 series · 10 of 33 positions shown, 12 images · non-contrast
Comparison: None.

CLINICAL DATA: Neck pain, initial exam

EXAM:
CT CERVICAL SPINE WITHOUT CONTRAST
TECHNIQUE: Multidetector CT imaging of the cervical spine was performed without
intravenous contrast. Multiplanar CT image reconstructions were also
generated.

[Series 4: c_spine 2.0 st · axial · 0.30mm/px · z∈[-160,-94]mm · 2 of 100 slices shown, 3 images]
[im 34/100  soft-tissue]
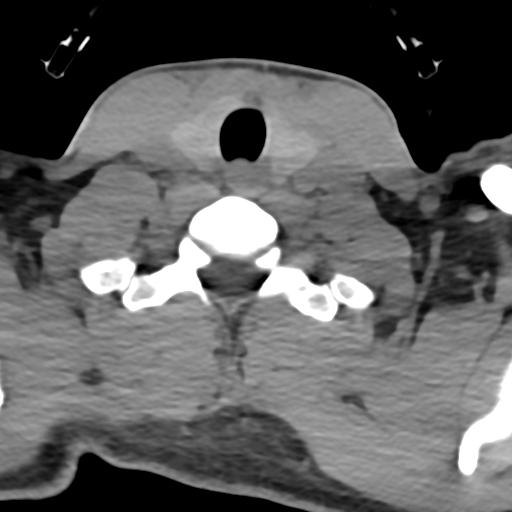
[im 34/100  bone]
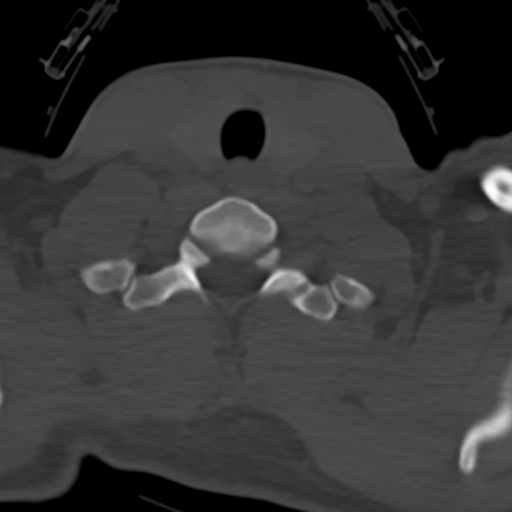
[im 67/100  bone]
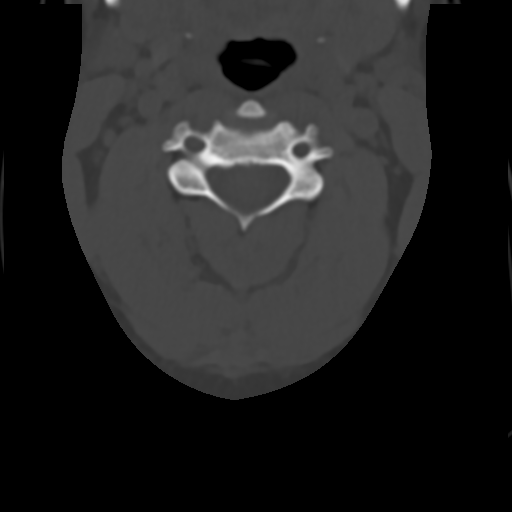

[Series 6: c_spine 2.0 sag bone · sagittal · 0.19mm/px · 5 of 61 slices shown, 6 images]
[im 21/61  bone]
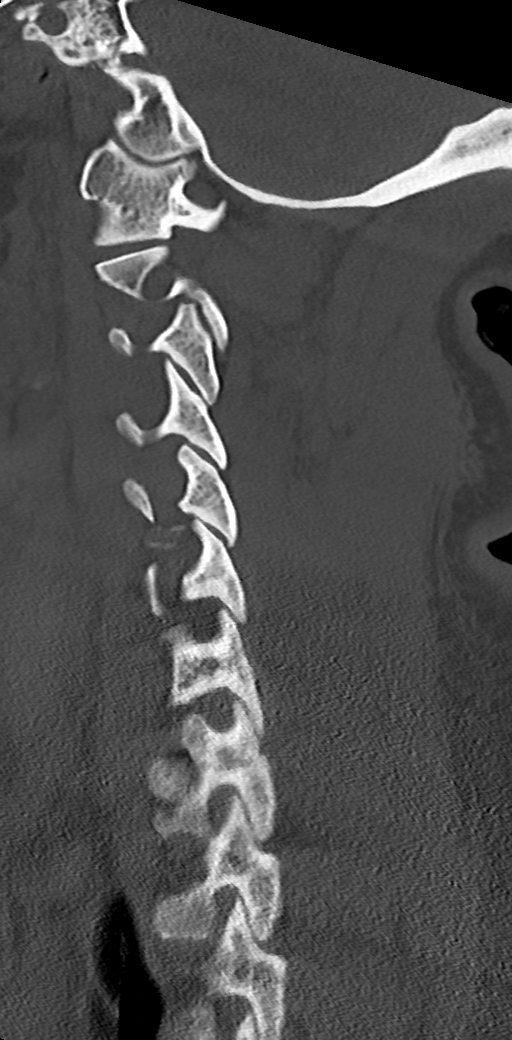
[im 26/61  bone]
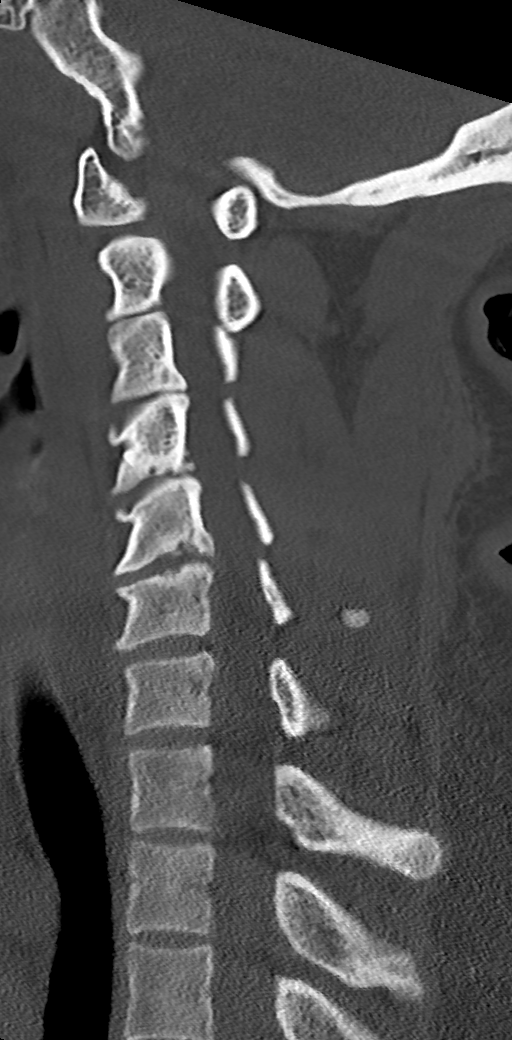
[im 31/61  soft-tissue]
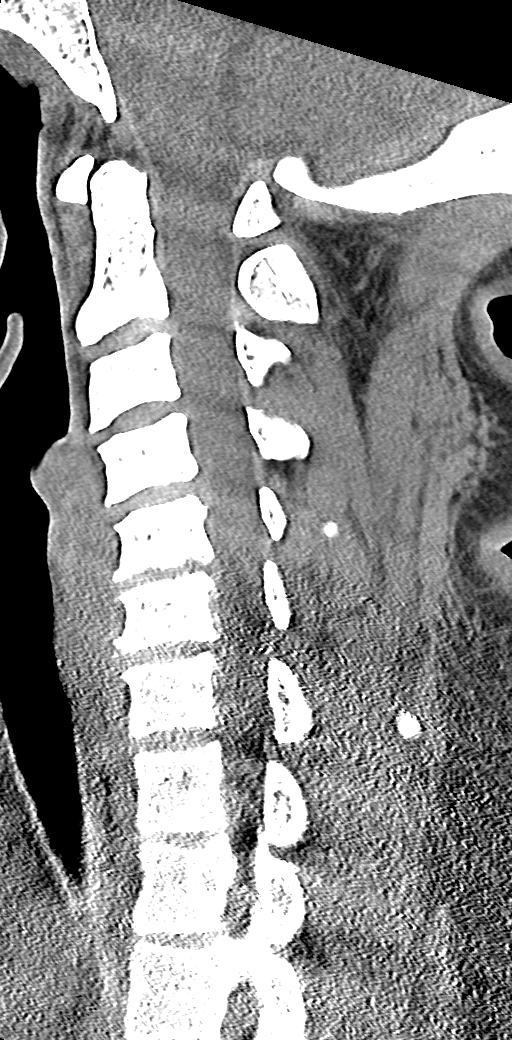
[im 31/61  bone]
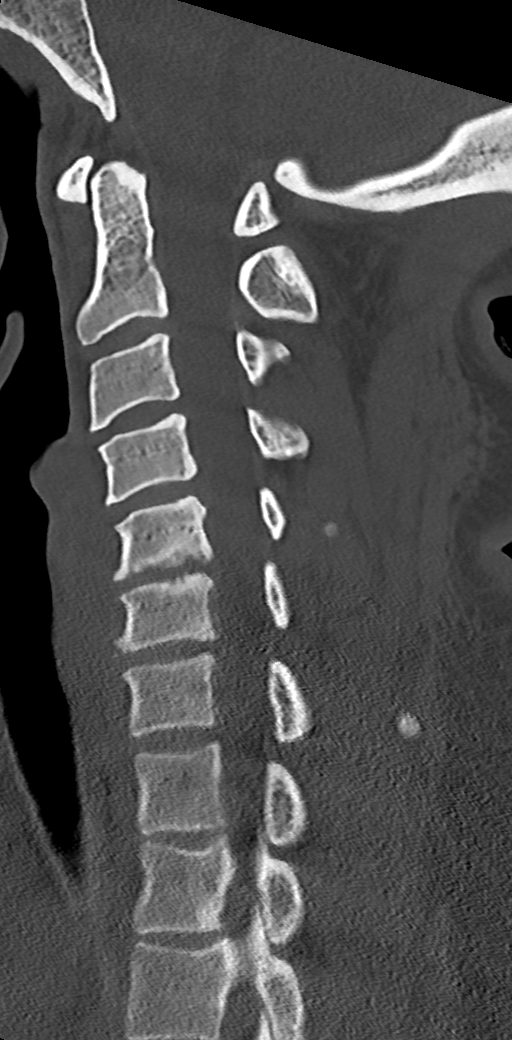
[im 36/61  bone]
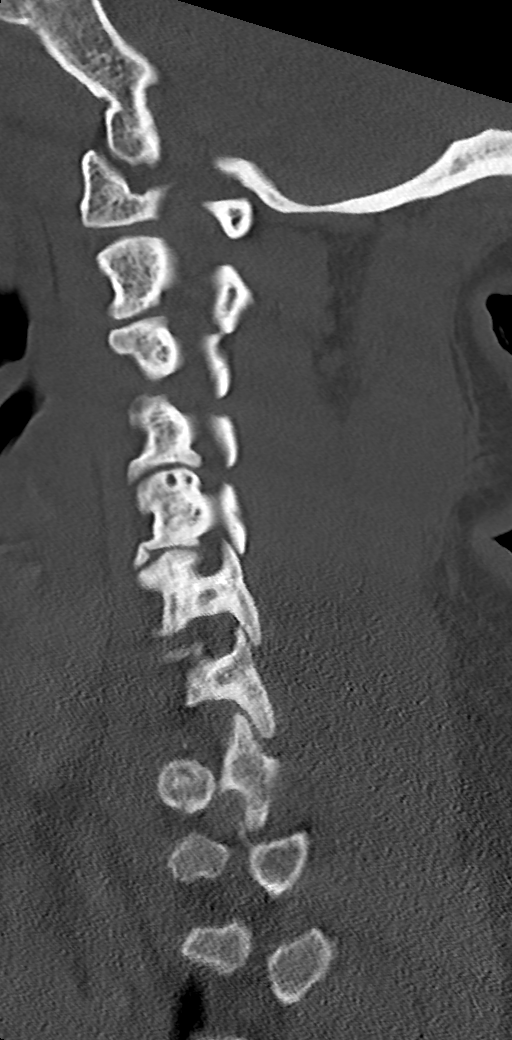
[im 41/61  bone]
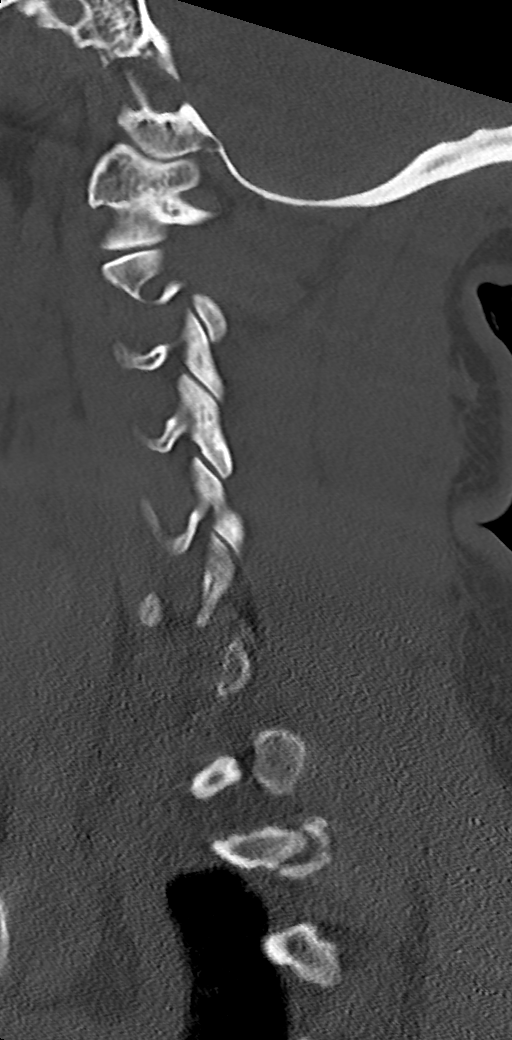

[Series 7: c_spine 2.0 cor bone · coronal · 0.25mm/px · 3 of 61 slices shown]
[im 13/61  bone]
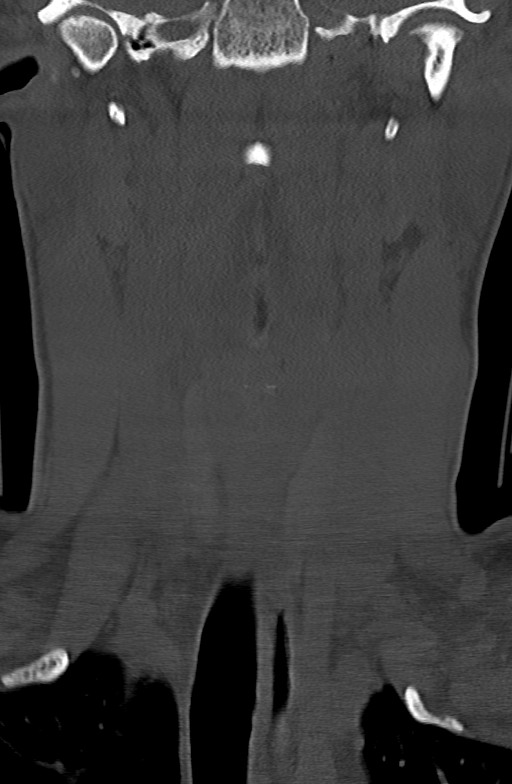
[im 25/61  bone]
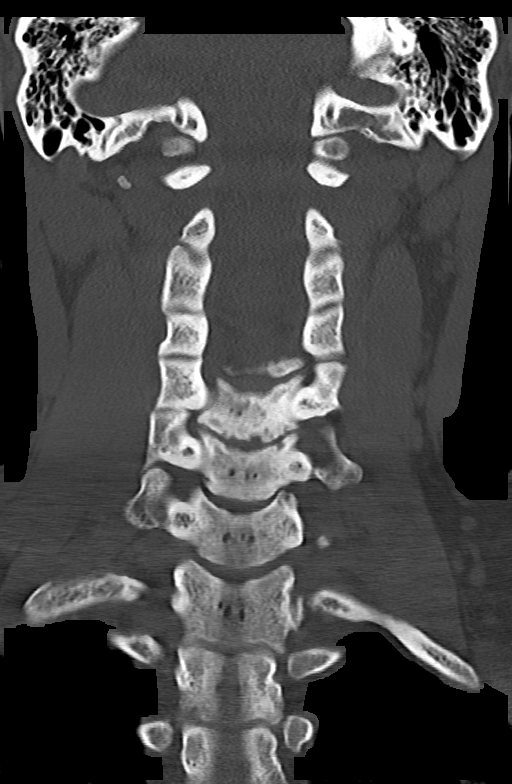
[im 37/61  bone]
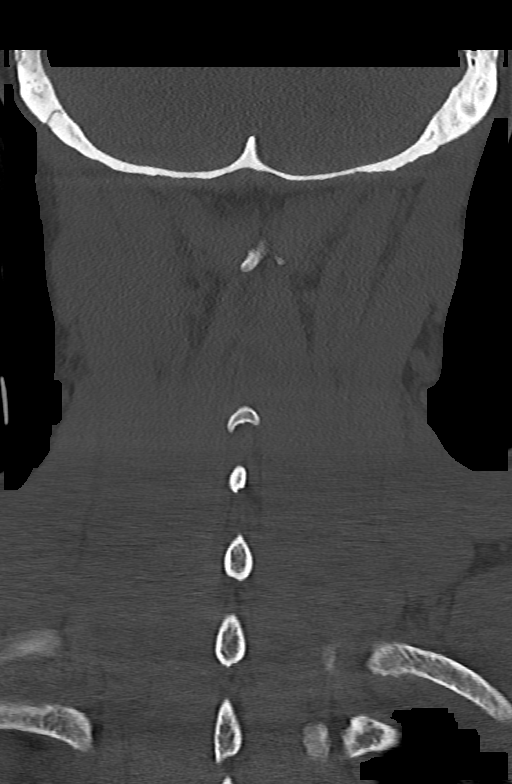

[10 of 33 positions shown; findings below may reference images not displayed]

FINDINGS: Alignment: There is straightening of the normal cervical
lordosisthnka which may be related to positioning or muscle spasm.

Skull base and vertebrae: No acute fracture. No primary bone lesion
or focal pathologic process.

Soft tissues and spinal canal: No prevertebral fluid or swelling. No
visible canal hematoma.

Disc levels: Mild degenerative changes at the C4 through C6 with
anterior spurring and disc space loss.

Upper chest: Negative.

Other: None.
IMPRESSION: No CT evidence for acute fracture or subluxation.
